# Patient Record
Sex: Male | Born: 1960 | Race: White | Hispanic: No | Smoking: Current every day smoker
Health system: Southern US, Community
[De-identification: ages and names within clinical notes are randomized; demographics above are authoritative.]

## PROBLEM LIST (undated history)

## (undated) HISTORY — PX: TONSILLECTOMY: SUR1361

## (undated) HISTORY — PX: REPLACEMENT TOTAL KNEE: SUR1224

---

## 2004-11-29 ENCOUNTER — Emergency Department: Payer: Self-pay | Admitting: Emergency Medicine

## 2008-03-15 ENCOUNTER — Ambulatory Visit: Payer: Self-pay | Admitting: Family Medicine

## 2008-09-25 ENCOUNTER — Ambulatory Visit: Payer: Self-pay | Admitting: Family Medicine

## 2008-10-10 ENCOUNTER — Ambulatory Visit: Payer: Self-pay | Admitting: Internal Medicine

## 2008-12-30 IMAGING — CT CT HEAD WITHOUT CONTRAST
1 series · 16 of 30 positions shown, 20 images · non-contrast
Comparison: none

REASON FOR EXAM: Headache status post head trauma
COMMENTS:

[Series 2: soft tissue · axial · 0.45mm/px · z∈[-186,-30]mm · 16 of 35 slices shown, 20 images]
[im 2/35  brain]
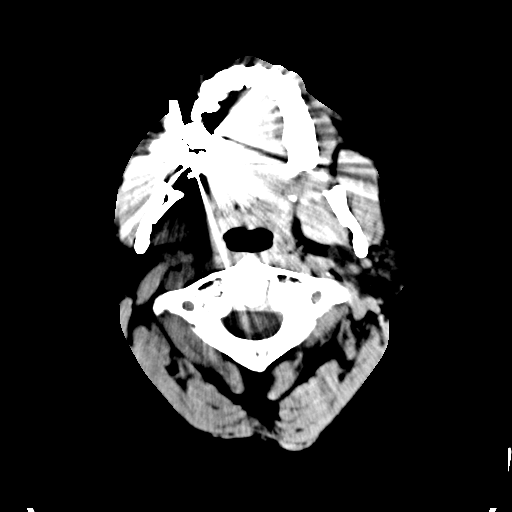
[im 2/35  bone]
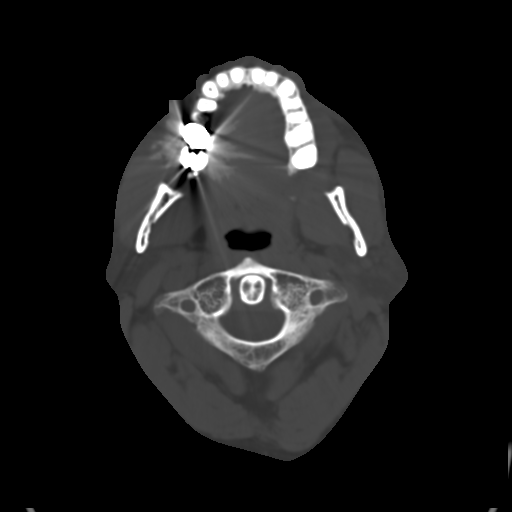
[im 4/35  brain]
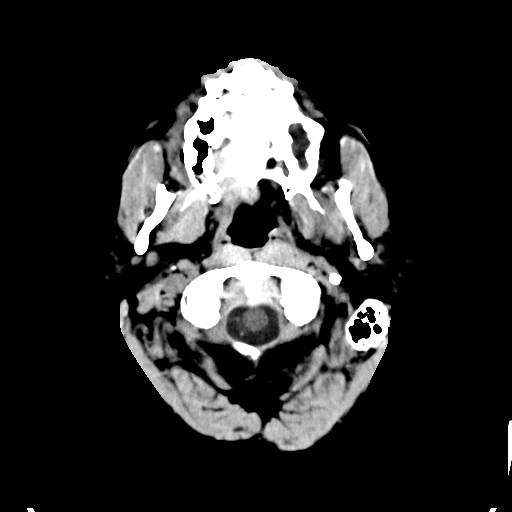
[im 6/35  brain]
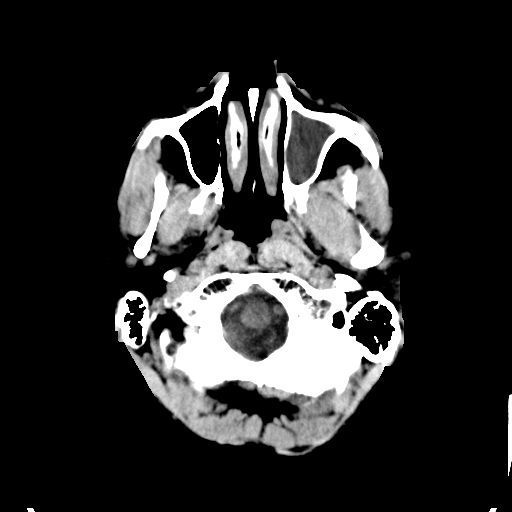
[im 9/35  brain]
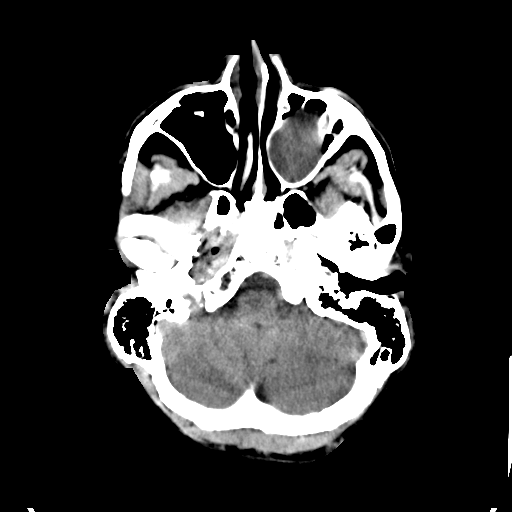
[im 10/35  brain]
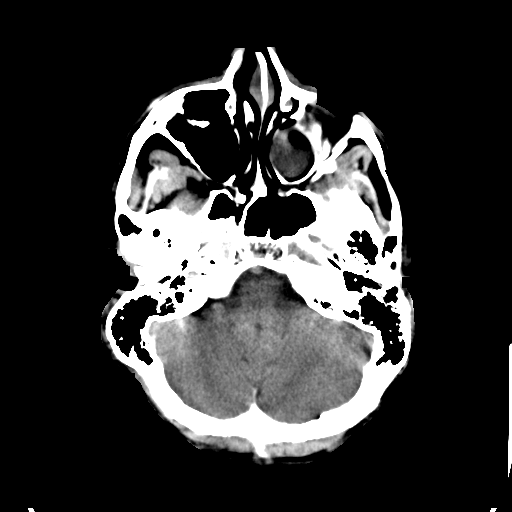
[im 10/35  bone]
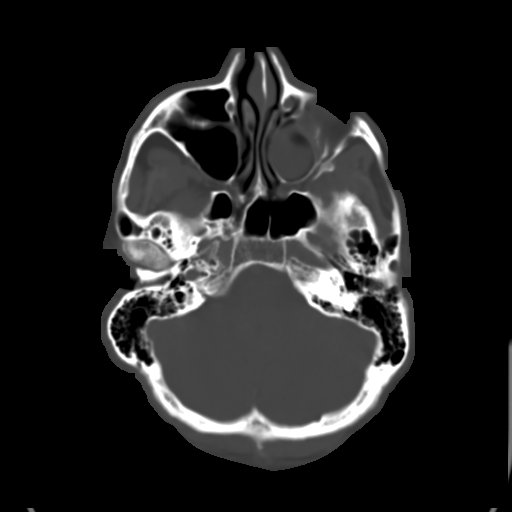
[im 12/35  brain]
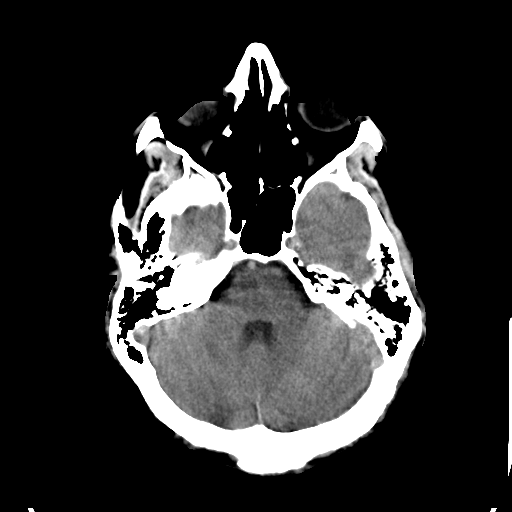
[im 15/35  brain]
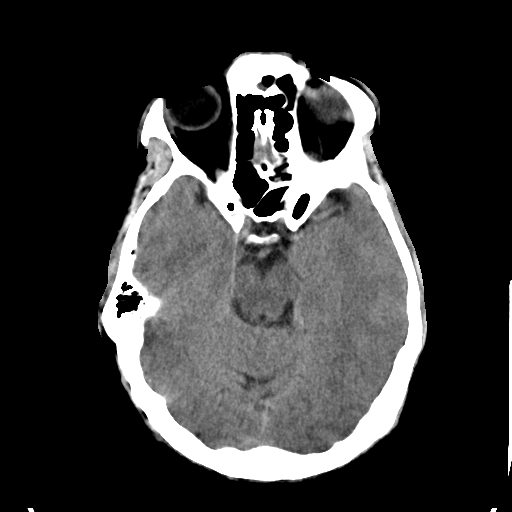
[im 17/35  brain]
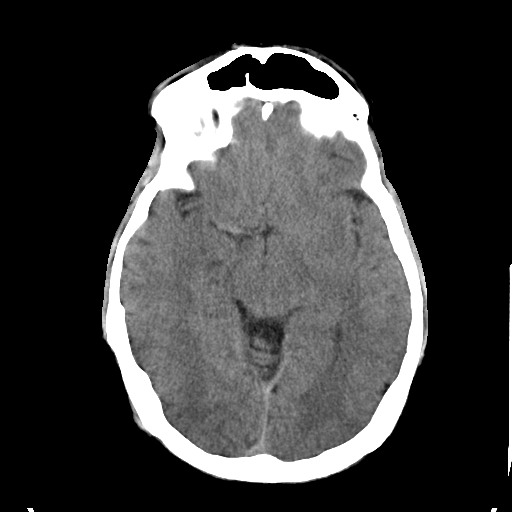
[im 18/35  brain]
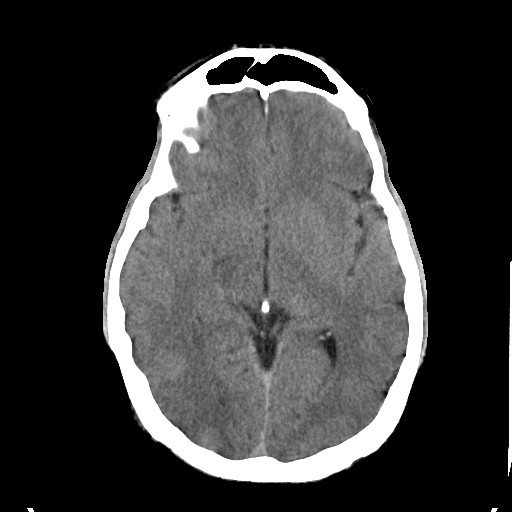
[im 18/35  bone]
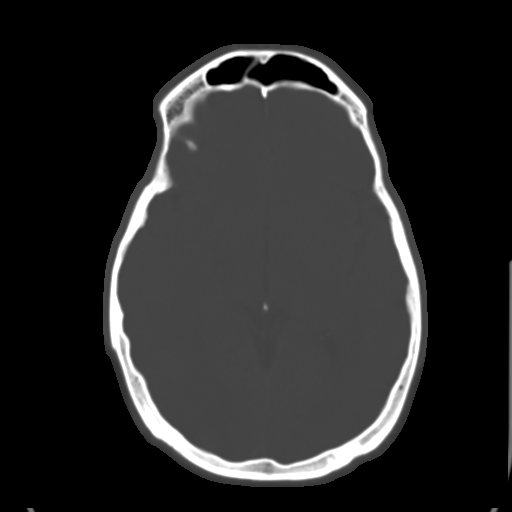
[im 20/35  brain]
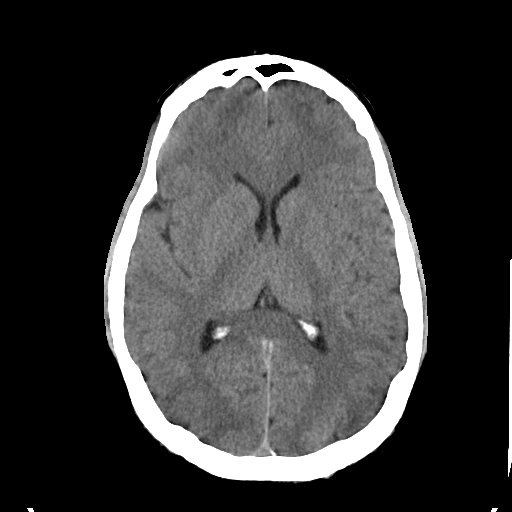
[im 23/35  brain]
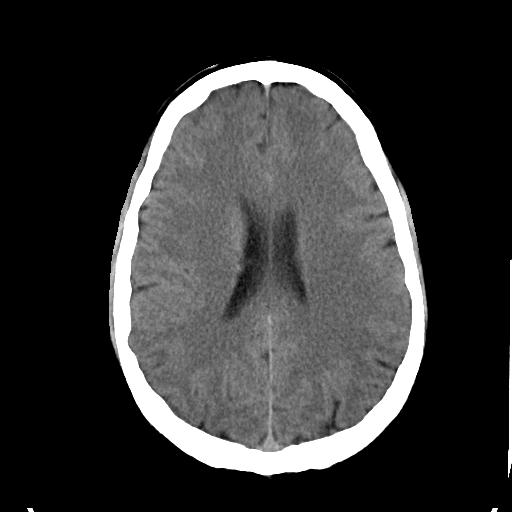
[im 25/35  brain]
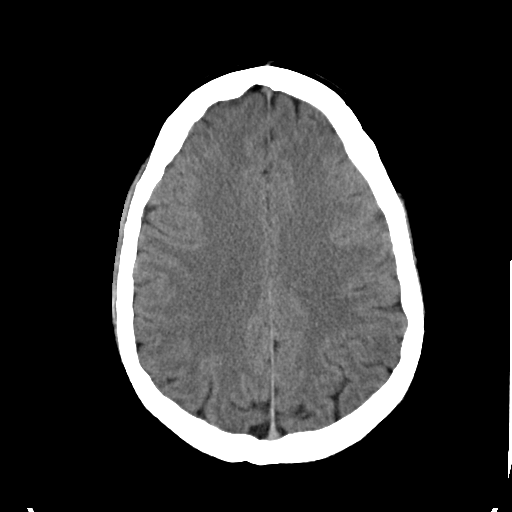
[im 26/35  brain]
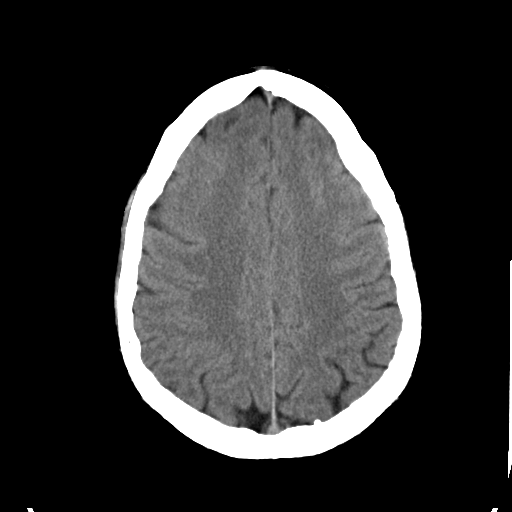
[im 26/35  bone]
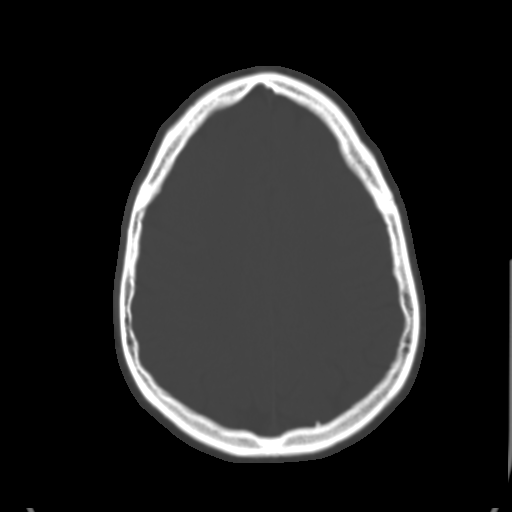
[im 29/35  brain]
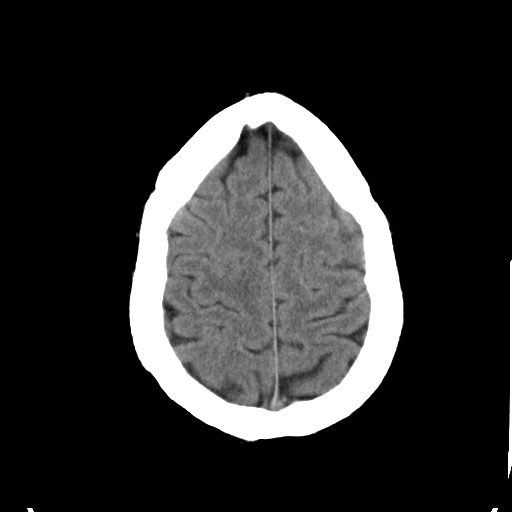
[im 31/35  brain]
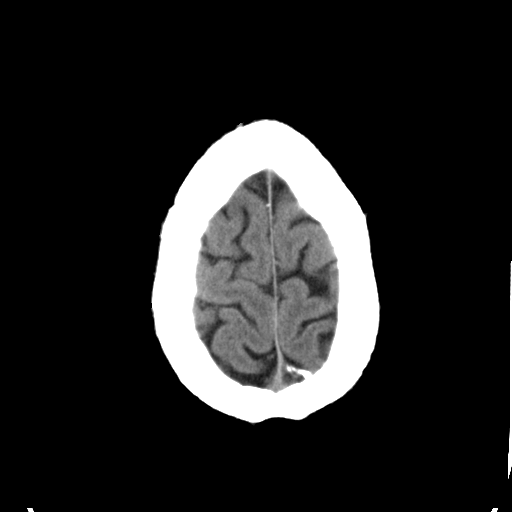
[im 33/35  brain]
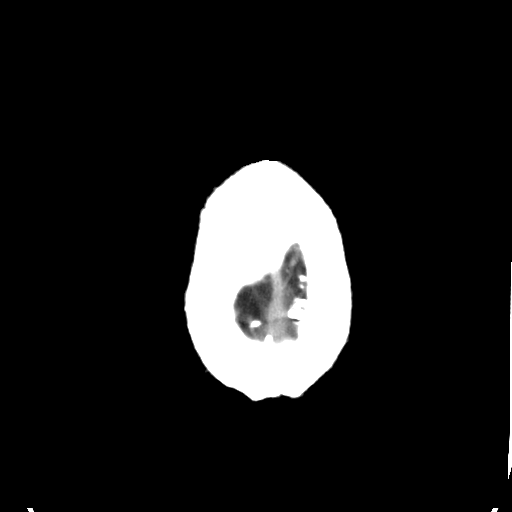

[16 of 30 positions shown; findings below may reference images not displayed]

PROCEDURE:     [facility]LE[facility] - HSSAYN ZARBAN WITHOUT CONTRAST  - March 15, 2008  [DATE]

RESULT:     There is no evidence of intra-axial or extra-axial fluid
collections or evidence of acute hemorrhage. No secondary signs are
appreciated to suggest mass effect or subacute or chronic infarction. The
visualized bony skeleton demonstrates no evidence of fracture or
dislocation. There is complete opacification of the LEFT maxillary sinus.
IMPRESSION: 1.     No evidence of acute intracranial abnormalities.
2.     Findings possibly representing sinus disease involving the LEFT
maxillary sinus.
3.     Dr. Moven Zackey of [HOSPITAL] [HOSPITAL] was informed of these findings
at the time of the initial interpretation.

## 2011-03-24 ENCOUNTER — Ambulatory Visit: Payer: Self-pay | Admitting: Internal Medicine

## 2011-09-19 ENCOUNTER — Ambulatory Visit (INDEPENDENT_AMBULATORY_CARE_PROVIDER_SITE_OTHER): Payer: Medicare FFS

## 2011-09-19 DIAGNOSIS — R05 Cough: Secondary | ICD-10-CM

## 2011-09-19 DIAGNOSIS — J029 Acute pharyngitis, unspecified: Secondary | ICD-10-CM

## 2011-09-19 DIAGNOSIS — R059 Cough, unspecified: Secondary | ICD-10-CM

## 2011-09-19 DIAGNOSIS — J209 Acute bronchitis, unspecified: Secondary | ICD-10-CM

## 2012-07-04 ENCOUNTER — Ambulatory Visit (INDEPENDENT_AMBULATORY_CARE_PROVIDER_SITE_OTHER): Payer: Medicare FFS | Admitting: Family Medicine

## 2012-07-04 VITALS — BP 133/80 | HR 80 | Temp 97.9°F | Resp 16 | Ht 72.0 in | Wt 223.0 lb

## 2012-07-04 DIAGNOSIS — H119 Unspecified disorder of conjunctiva: Secondary | ICD-10-CM

## 2012-07-04 MED ORDER — TOBRAMYCIN 0.3 % OP SOLN: 1.0000 [drp] | Freq: Four times a day (QID) | OPHTHALMIC | Status: DC

## 2012-07-04 NOTE — Patient Instructions (Addendum)
Conjunctivitis Conjunctivitis is commonly called "pink eye." Conjunctivitis can be caused by bacterial or viral infection, allergies, or injuries. There is usually redness of the lining of the eye, itching, discomfort, and sometimes discharge. There may be deposits of matter along the eyelids. A viral infection usually causes a watery discharge, while a bacterial infection causes a yellowish, thick discharge. Pink eye is very contagious and spreads by direct contact. You may be given antibiotic eyedrops as part of your treatment. Before using your eye medicine, remove all drainage from the eye by washing gently with warm water and cotton balls. Continue to use the medication until you have awakened 2 mornings in a row without discharge from the eye. Do not rub your eye. This increases the irritation and helps spread infection. Use separate towels from other household members. Wash your hands with soap and water before and after touching your eyes. Use cold compresses to reduce pain and sunglasses to relieve irritation from light. Do not wear contact lenses or wear eye makeup until the infection is gone. SEEK MEDICAL CARE IF:   Your symptoms are not better after 3 days of treatment.  You have increased pain or trouble seeing.  The outer eyelids become very red or swollen. Document Released: 10/09/2004 Document Revised: 11/24/2011 Document Reviewed: 09/01/2005 Exit[employer] Patient Information 2013 [employer].  

## 2012-07-04 NOTE — Progress Notes (Signed)
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 He's had some discharge from the eye as well. Complains of slightly blurry vision but no loss of vision, no eye pain, no fever. He has had a sinus infection recently with nasal congestion.  Objective: Inspection of the eye reveals normal extraocular motion, diffuse injection of scleral vessels, nipples equal reactive to light, normal fundus.  Assessment: Conjunctivitis, acute   1. Conjunctival disorder  tobramycin (TOBREX) 0.3 % ophthalmic solution

## 2014-03-24 ENCOUNTER — Ambulatory Visit (INDEPENDENT_AMBULATORY_CARE_PROVIDER_SITE_OTHER): Payer: Medicare FFS | Admitting: Emergency Medicine

## 2014-03-24 VITALS — BP 114/70 | HR 68 | Temp 97.6°F | Resp 16 | Ht 73.0 in | Wt 226.0 lb

## 2014-03-24 DIAGNOSIS — H00019 Hordeolum externum unspecified eye, unspecified eyelid: Secondary | ICD-10-CM

## 2014-03-24 DIAGNOSIS — H119 Unspecified disorder of conjunctiva: Secondary | ICD-10-CM

## 2014-03-24 DIAGNOSIS — H00013 Hordeolum externum right eye, unspecified eyelid: Secondary | ICD-10-CM

## 2014-03-24 MED ORDER — TOBRAMYCIN 0.3 % OP SOLN: 1.0000 [drp] | OPHTHALMIC | Status: DC

## 2014-03-24 NOTE — Progress Notes (Signed)
[facility] [address], [place] [id number] [phone]  Date:  03/24/2014   Name:  Cody MorDavid W Higgins   DOB:  09-08-1961   MRN:  [id number]  PCP:  No primary provider on file.    Chief Complaint: Belepharitis   History of Present Illness:  Cody MorDavid W Higgins is a 53 y.o. very pleasant male patient who presents with the following:  Since Tuesday has swollen right upper lid associated with redness and a scratchy feeling.  He had a "pimple" on the lid margin that is now gone.  He has no visual symptoms and no history of trauma.  Little change with warm compresses. Denies other complaint or health concern today.   There are no active problems to display for this patient.   History reviewed. No pertinent past medical history.  History reviewed. No pertinent past surgical history.  History  Substance Use Topics  . Smoking status: Current Every Day Smoker -- 30 years  . Smokeless tobacco: Not on file  . Alcohol Use: Not on file    History reviewed. No pertinent family history.  No Known Allergies  Medication list has been reviewed and updated.  No current outpatient prescriptions on file prior to visit.   No current facility-administered medications on file prior to visit.    Review of Systems:  As per HPI, otherwise negative.  ]  Physical Examination: Filed Vitals:   03/24/14 0911  BP: 114/70  Pulse: 68  Temp: 97.6 F (36.4 C)  Resp: 16   Filed Vitals:   03/24/14 0911  Height: 6\' 1"  (1.854 m)  Weight: 226 lb (102.513 kg)   Body mass index is 29.82 kg/(m^2). Ideal Body Weight: Weight in (lb) to have BMI = 25: 189.1   GEN: WDWN, NAD, Non-toxic, Alert & Oriented x 3 HEENT: Atraumatic, Normocephalic.  Right upper lid swollen and red.  No FB or injection.   Ears and Nose: No external deformity. EXTR: No clubbing/cyanosis/edema NEURO: Normal gait.  PSYCH: Normally interactive. Conversant. Not depressed or anxious appearing.  Calm demeanor.     Assessment and Plan: Horde[place]um  Signed,  Phillips OdorJeffery Deylan Canterbury, MD

## 2014-03-24 NOTE — Patient Instructions (Signed)

## 2014-06-12 ENCOUNTER — Ambulatory Visit: Payer: Self-pay | Admitting: Unknown Physician Specialty

## 2014-06-15 LAB — PATHOLOGY REPORT

## 2020-11-02 ENCOUNTER — Ambulatory Visit
Admission: EM | Admit: 2020-11-02 | Discharge: 2020-11-02 | Disposition: A | Discharge status: Home / Self Care | Payer: 59 | Attending: Internal Medicine | Admitting: Internal Medicine

## 2020-11-02 ENCOUNTER — Other Ambulatory Visit: Payer: Self-pay

## 2020-11-02 ENCOUNTER — Encounter: Payer: Self-pay | Admitting: Emergency Medicine

## 2020-11-02 DIAGNOSIS — B353 Tinea pedis: Secondary | ICD-10-CM

## 2020-11-02 MED ORDER — MICONAZOLE NITRATE 2 % EX POWD: CUTANEOUS | 0 refills | Status: DC | PRN

## 2020-11-02 MED ORDER — CEPHALEXIN 500 MG PO CAPS: 500.0000 mg | ORAL_CAPSULE | Freq: Three times a day (TID) | ORAL | 0 refills | Status: DC

## 2020-11-02 MED ORDER — CEPHALEXIN 500 MG PO CAPS: 500.0000 mg | ORAL_CAPSULE | Freq: Three times a day (TID) | ORAL | 0 refills | Status: AC

## 2020-11-02 NOTE — ED Triage Notes (Signed)
Patient states that he has infected athletes foot in his right foot that started 2 weeks ago.  Patient reports redness, swelling and pain .

## 2020-11-02 NOTE — Discharge Instructions (Addendum)
Avoid soaking your feet in water Please wear open toe footwear Use medications as directed If symptoms worsen please return to urgent care to be reevaluated.

## 2020-11-02 NOTE — ED Provider Notes (Signed)
[facility]    CSN: [id number] Arrival date & time: 11/02/20  1031      History   Chief Complaint Chief Complaint  Patient presents with  . Foot Pain    right    HPI Cody Higgins is a 60 y.o. male comes to the urgent care with 2-week history of tinea pedis involving the right fourth and fifth toes.  He has been applying antifungal spray with no improvement in his symptoms.  He recently noted increased swelling, redness and pain.  Patient is not diabetic.  He comes to the urgent care to be evaluated.  No fever or chills.  No trauma to the foot.  HPI  History reviewed. No pertinent past medical history.  There are no problems to display for this patient.   History reviewed. No pertinent surgical history.     Home Medications    Prior to Admission medications   Medication Sig Start Date End Date Taking? Authorizing Provider  miconazole (MICOTIN) 2 % powder Apply topically as needed for itching. 11/02/20  Yes Kennen Stammer, Britta Mccreedy, MD  cephALEXin (KEFLEX) 500 MG capsule Take 1 capsule (500 mg total) by mouth 3 (three) times daily for 5 days. 11/02/20 11/07/20  Merrilee Jansky, MD    Family History History reviewed. No pertinent family history.  Social History Social History   Tobacco Use  . Smoking status: Current Every Day Smoker    Years: 30.00    Types: Cigarettes  . Smokeless tobacco: Never Used  Vaping Use  . Vaping Use: Never used  Substance Use Topics  . Alcohol use: Not Currently  . Drug use: Never     Allergies   Patient has no known allergies.   Review of Systems Review of Systems  Musculoskeletal: Positive for arthralgias and joint swelling. Negative for myalgias.  Skin: Positive for color change. Negative for pallor and wound.  Neurological: Negative.      Physical Exam Triage Vital Signs ED Triage Vitals  Enc Vitals Group     BP 11/02/20 1100 120/84     Pulse Rate 11/02/20 1100 60     Resp 11/02/20 1100 16     Temp  11/02/20 1100 98.8 F (37.1 C)     Temp Source 11/02/20 1100 Oral     SpO2 11/02/20 1100 97 %     Weight 11/02/20 1058 230 lb (104.3 kg)     Height 11/02/20 1058 6\' 1"  (1.854 m)     Head Circumference --      Peak Flow --      Pain Score 11/02/20 1058 1     Pain Loc --      Pain Edu? --      Excl. in GC? --    No data found.  Updated Vital Signs BP 120/84 (BP Location: Right Arm)   Pulse 60   Temp 98.8 F (37.1 C) (Oral)   Resp 16   Ht 6\' 1"  (1.854 m)   Wt 104.3 kg   SpO2 97%   BMI 30.34 kg/m   Visual Acuity Right Eye Distance:   Left Eye Distance:   Bilateral Distance:    Right Eye Near:   Left Eye Near:    Bilateral Near:     Physical Exam Vitals and nursing note reviewed.  Musculoskeletal:        General: Swelling and tenderness present. No deformity. Normal range of motion.  Skin:    General: Skin is warm.  Coloration: Skin is not pale.     Findings: Erythema present. No bruising or lesion.      UC Treatments / Results  Labs (all labs ordered are listed, but only abnormal results are displayed) Labs Reviewed - No data to display  EKG   Radiology No results found.  Procedures Procedures (including critical care time)  Medications Ordered in UC Medications - No data to display  Initial Impression / Assessment and Plan / UC Course  I have reviewed the triage vital signs and the nursing notes.  Pertinent labs & imaging results that were available during my care of the patient were reviewed by me and considered in my medical decision making (see chart for details).     1.  Tinea pedis of the right foot with cellulitic changes: Miconazole powder Avoid soaking feet in order Keflex 500 mg 3 times daily for 5 days Open to foot wear advised Return to urgent care if symptoms worsen. Final Clinical Impressions(s) / UC Diagnoses   Final diagnoses:  Tinea pedis of right foot     Discharge Instructions     Avoid soaking your feet in  water Please wear open toe footwear Use medications as directed If symptoms worsen please return to urgent care to be reevaluated.   ED Prescriptions    Medication Sig Dispense Auth. Provider   miconazole (MICOTIN) 2 % powder Apply topically as needed for itching. 70 g Kynslei Art, Britta Mccreedy, MD   cephALEXin (KEFLEX) 500 MG capsule  (Status: Discontinued) Take 1 capsule (500 mg total) by mouth 3 (three) times daily for 5 days. 15 capsule Wayne Wicklund, Britta Mccreedy, MD   cephALEXin (KEFLEX) 500 MG capsule Take 1 capsule (500 mg total) by mouth 3 (three) times daily for 5 days. 15 capsule Soniyah Mcglory, Britta Mccreedy, MD     PDMP not reviewed this encounter.   Merrilee Jansky, MD 11/02/20 [phone]

## 2021-01-09 ENCOUNTER — Other Ambulatory Visit: Payer: Self-pay

## 2021-01-09 ENCOUNTER — Encounter: Payer: Self-pay | Admitting: Emergency Medicine

## 2021-01-09 ENCOUNTER — Emergency Department: Payer: 59

## 2021-01-09 ENCOUNTER — Emergency Department
Admission: EM | Admit: 2021-01-09 | Discharge: 2021-01-09 | Disposition: A | Discharge status: Home / Self Care | Payer: 59 | Attending: Emergency Medicine | Admitting: Emergency Medicine

## 2021-01-09 DIAGNOSIS — F17210 Nicotine dependence, cigarettes, uncomplicated: Secondary | ICD-10-CM | POA: Insufficient documentation

## 2021-01-09 DIAGNOSIS — J441 Chronic obstructive pulmonary disease with (acute) exacerbation: Secondary | ICD-10-CM | POA: Insufficient documentation

## 2021-01-09 DIAGNOSIS — R0789 Other chest pain: Secondary | ICD-10-CM

## 2021-01-09 DIAGNOSIS — R079 Chest pain, unspecified: Secondary | ICD-10-CM | POA: Diagnosis present

## 2021-01-09 LAB — CBC
HCT: 42.8 % (ref 39.0–52.0)
Hemoglobin: 14.5 g/dL (ref 13.0–17.0)
MCH: 29.4 pg (ref 26.0–34.0)
MCHC: 33.9 g/dL (ref 30.0–36.0)
MCV: 86.6 fL (ref 80.0–100.0)
Platelets: 252 10*3/uL (ref 150–400)
RBC: 4.94 MIL/uL (ref 4.22–5.81)
RDW: 14.4 % (ref 11.5–15.5)
WBC: 9.3 10*3/uL (ref 4.0–10.5)
nRBC: 0 % (ref 0.0–0.2)

## 2021-01-09 LAB — BASIC METABOLIC PANEL
Anion gap: 10 (ref 5–15)
BUN: 16 mg/dL (ref 6–20)
CO2: 23 mmol/L (ref 22–32)
Calcium: 9 mg/dL (ref 8.9–10.3)
Chloride: 105 mmol/L (ref 98–111)
Creatinine, Ser: 1.25 mg/dL — ABNORMAL HIGH (ref 0.61–1.24)
GFR, Estimated: >60 mL/min (ref >60)
Glucose, Bld: 104 mg/dL — ABNORMAL HIGH (ref 70–99)
Potassium: 3.9 mmol/L (ref 3.5–5.1)
Sodium: 138 mmol/L (ref 135–145)

## 2021-01-09 LAB — TROPONIN I (HIGH SENSITIVITY)
Troponin I (High Sensitivity): 4 ng/L (ref <18)
Troponin I (High Sensitivity): 4 ng/L (ref <18)

## 2021-01-09 MED ORDER — PREDNISONE 50 MG PO TABS: 50.0000 mg | ORAL_TABLET | Freq: Every day | ORAL | 0 refills | Status: DC

## 2021-01-09 MED ORDER — METHYLPREDNISOLONE SODIUM SUCC 125 MG IJ SOLR
125.0000 mg | Freq: Once | INTRAMUSCULAR | Status: AC
  Administered 2021-01-09: 125 mg via INTRAVENOUS
  Filled 2021-01-09: qty 2

## 2021-01-09 MED ORDER — ALBUTEROL SULFATE HFA 108 (90 BASE) MCG/ACT IN AERS: 2.0000 | INHALATION_SPRAY | Freq: Four times a day (QID) | RESPIRATORY_TRACT | 2 refills | Status: DC | PRN

## 2021-01-09 MED ORDER — IPRATROPIUM-ALBUTEROL 0.5-2.5 (3) MG/3ML IN SOLN
3.0000 mL | Freq: Once | RESPIRATORY_TRACT | Status: AC
  Administered 2021-01-09: 3 mL via RESPIRATORY_TRACT
  Filled 2021-01-09: qty 3

## 2021-01-09 MED ORDER — PREDNISONE 50 MG PO TABS: 50.0000 mg | ORAL_TABLET | Freq: Every day | ORAL | 0 refills | Status: AC

## 2021-01-09 NOTE — Discharge Instructions (Signed)
As we discussed, I suspect your symptoms are more related to your lungs and your heart.  If you continue to smoke, these will make it worse and more frequent.  You are being discharged with a prescription for prednisone steroids to take once daily for the next 4 days starting tomorrow.  We gave a dose of steroids today and IV while you are here. Albuterol inhaler/puffer to use at home as needed.  You do not have to use this every day.  Use as needed for any shortness of breath.  Use 1-2 puffs per dose, do not use more frequently than every 4-6 hours.  Attached is a phone number for [facility] to call and establish with a primary care physician.  Return to the ED with any further worsening symptoms, fevers with your symptoms, passing out or vomiting with your symptoms.

## 2021-01-09 NOTE — ED Notes (Signed)
Report received from Kassie RN

## 2021-01-09 NOTE — ED Triage Notes (Signed)
Pt to ED via POV w/ c/o mid/L upper chest pain that radiates to L arm "all the way down to fingers". Pt states pain x several weeks.

## 2021-01-09 NOTE — ED Provider Notes (Signed)
[facility] Emergency Department Provider Note ____________________________________________   Event Date/Time   First MD Initiated Contact with Patient 01/09/21 1806     (approximate)  I have reviewed the triage vital signs and the nursing notes.  HISTORY  Chief Complaint Chest Pain   HPI Cody Higgins is a 60 y.o. malewho presents to the ED for evaluation of chest pain.   Chart review indicates neuro medical history. Patient reports lifelong smoking history for which he takes no inhalers medications.  2 episodes of chest pain over the past couple weeks.  Shortness of breath and increased cough without sputum production alongside this, but more consistently.  Denies fevers, emesis or syncopal episodes.  Denies chest pain now and reports feeling okay.  No past medical history on file.  There are no problems to display for this patient.   Past Surgical History:  Procedure Laterality Date  . REPLACEMENT TOTAL KNEE    . TONSILLECTOMY      Prior to Admission medications   Medication Sig Start Date End Date Taking? Authorizing Provider  albuterol (VENTOLIN HFA) 108 (90 Base) MCG/ACT inhaler Inhale 2 puffs into the lungs every 6 (six) hours as needed for wheezing or shortness of breath. 01/09/21   Delton Prairie, MD  miconazole (MICOTIN) 2 % powder Apply topically as needed for itching. 11/02/20   Lamptey, Britta Mccreedy, MD  predniSONE (DELTASONE) 50 MG tablet Take 1 tablet (50 mg total) by mouth daily with breakfast for 4 days. 01/09/21 01/13/21  Delton Prairie, MD    Allergies Patient has no known allergies.  No family history on file.  Social History Social History   Tobacco Use  . Smoking status: Current Every Day Smoker    Years: 30.00    Types: Cigarettes  . Smokeless tobacco: Never Used  Vaping Use  . Vaping Use: Never used  Substance Use Topics  . Alcohol use: Not Currently  . Drug use: Never    Review of Systems  Constitutional: No  fever/chills Eyes: No visual changes. ENT: No sore throat. Cardiovascular: Positive for chest pain Respiratory: Positive for cough and shortness of breath Gastrointestinal: No abdominal pain.  No nausea, no vomiting.  No diarrhea.  No constipation. Genitourinary: Negative for dysuria. Musculoskeletal: Negative for back pain. Skin: Negative for rash. Neurological: Negative for headaches, focal weakness or numbness.  ____________________________________________   PHYSICAL EXAM:  VITAL SIGNS: Vitals:   01/09/21 2030 01/09/21 2100  BP: 113/72 (!) 115/92  Pulse: (!) 51 (!) 57  Resp: (!) 24 (!) 23  Temp:    SpO2: 100% 93%     Constitutional: Alert and oriented. Well appearing and in no acute distress. Eyes: Conjunctivae are normal. PERRL. EOMI. Head: Atraumatic. Nose: No congestion/rhinnorhea. Mouth/Throat: Mucous membranes are moist.  Oropharynx non-erythematous. Neck: No stridor. No cervical spine tenderness to palpation. Cardiovascular: Normal rate, regular rhythm. Grossly normal heart sounds.  Good peripheral circulation. Respiratory: Normal respiratory effort.  No retractions.  Prolonged expiratory phase and diffuse scattered expiratory wheezes.  No focal features.. Gastrointestinal: Soft , nondistended, nontender to palpation. No CVA tenderness. Musculoskeletal: No lower extremity tenderness nor edema.  No joint effusions. No signs of acute trauma. Neurologic:  Normal speech and language. No gross focal neurologic deficits are appreciated. No gait instability noted. Skin:  Skin is warm, dry and intact. No rash noted. Psychiatric: Mood and affect are normal. Speech and behavior are normal.  ____________________________________________   LABS (all labs ordered are listed, but only abnormal results  are displayed)  Labs Reviewed  BASIC METABOLIC PANEL - Abnormal; Notable for the following components:      Result Value   Glucose, Bld 104 (*)    Creatinine, Ser 1.25 (*)     All other components within normal limits  CBC  TROPONIN I (HIGH SENSITIVITY)  TROPONIN I (HIGH SENSITIVITY)   ____________________________________________  12 Lead EKG  Sinus rhythm, rate of 55 bpm.  Normal axis and normal intervals.  No evidence of acute ischemia.  Sinus bradycardia. ____________________________________________  RADIOLOGY  ED MD interpretation: 2 view CXR reviewed by me without evidence of acute cardiopulmonary pathology.  Official radiology report(s): DG Chest 2 View  Result Date: 01/09/2021 CLINICAL DATA:  Chest pain EXAM: CHEST - 2 VIEW COMPARISON:  September 19, 2011 FINDINGS: The heart size and mediastinal contours are within normal limits. Linear band of atelectasis or scarring in the peripheral left mid lung. No focal consolidation. No pleural effusion. No pneumothorax. No acute osseous abnormality visualized. IMPRESSION: 1. No acute cardiopulmonary disease Electronically Signed   By: Maudry Mayhew MD   On: 01/09/2021 18:49    ____________________________________________   PROCEDURES and INTERVENTIONS  Procedure(s) performed (including Critical Care):  .1-3 Lead EKG Interpretation Performed by: Delton Prairie, MD Authorized by: Delton Prairie, MD     Interpretation: normal     ECG rate:  58   ECG rate assessment: normal     Rhythm: sinus rhythm     Ectopy: none     Conduction: normal      Medications  methylPREDNISolone sodium succinate (SOLU-MEDROL) 125 mg/2 mL injection 125 mg (125 mg Intravenous Given 01/09/21 2023)  ipratropium-albuterol (DUONEB) 0.5-2.5 (3) MG/3ML nebulizer solution 3 mL (3 mLs Nebulization Given 01/09/21 2024)    ____________________________________________   MDM / ED COURSE   60 year old lifelong smoker presents to the ED with intermittent chest pains, shortness of breath and nonproductive cough, consistent with COPD exacerbation without evidence of ACS, and amenable to outpatient management.  Normal vitals on room air.   Exam with some stigmata of COPD exacerbation with diffuse expiratory wheezes and prolonged expiratory phase, but no distress.  No evidence of ACS, acute ischemia, NSTEMI.  CXR without infiltrates and patient has no increased sputum production to suggest infectious etiology of his symptoms.  Resolving symptoms with steroids and albuterol.  We discussed management of his respiratory status at home, recommended cessation from tobacco.  We discussed return precautions for the ED     ____________________________________________   FINAL CLINICAL IMPRESSION(S) / ED DIAGNOSES  Final diagnoses:  Other chest pain  COPD exacerbation [facility])     ED Discharge Orders         Ordered    predniSONE (DELTASONE) 50 MG tablet  Daily with breakfast,   Status:  Discontinued        01/09/21 2133    albuterol (VENTOLIN HFA) 108 (90 Base) MCG/ACT inhaler  Every 6 hours PRN,   Status:  Discontinued        01/09/21 2133    predniSONE (DELTASONE) 50 MG tablet  Daily with breakfast        01/09/21 2149    albuterol (VENTOLIN HFA) 108 (90 Base) MCG/ACT inhaler  Every 6 hours PRN        01/09/21 2149           Jeramie Scogin   Note:  This document was prepared using Dragon voice recognition software and may include unintentional dictation errors.   Delton Prairie, MD 01/10/21 [phone]

## 2021-01-09 NOTE — ED Notes (Signed)
Patient transported to X-ray 

## 2021-06-17 ENCOUNTER — Encounter: Payer: Self-pay | Admitting: Emergency Medicine

## 2021-06-17 ENCOUNTER — Other Ambulatory Visit: Payer: Self-pay

## 2021-06-17 ENCOUNTER — Ambulatory Visit
Admission: EM | Admit: 2021-06-17 | Discharge: 2021-06-17 | Disposition: A | Discharge status: Home / Self Care | Payer: 59 | Attending: Emergency Medicine | Admitting: Emergency Medicine

## 2021-06-17 DIAGNOSIS — S0502XA Injury of conjunctiva and corneal abrasion without foreign body, left eye, initial encounter: Secondary | ICD-10-CM

## 2021-06-17 DIAGNOSIS — Z23 Encounter for immunization: Secondary | ICD-10-CM

## 2021-06-17 MED ORDER — TETANUS-DIPHTH-ACELL PERTUSSIS 5-2.5-18.5 LF-MCG/0.5 IM SUSY
0.5000 mL | PREFILLED_SYRINGE | Freq: Once | INTRAMUSCULAR | Status: AC
  Administered 2021-06-17: 0.5 mL via INTRAMUSCULAR

## 2021-06-17 MED ORDER — TETRACAINE HCL 0.5 % OP SOLN
2.0000 [drp] | Freq: Once | OPHTHALMIC | Status: AC
  Administered 2021-06-17: 2 [drp] via OPHTHALMIC

## 2021-06-17 MED ORDER — TOBRAMYCIN-DEXAMETHASONE 0.3-0.1 % OP SUSP: 2.0000 [drp] | Freq: Four times a day (QID) | OPHTHALMIC | 0 refills | Status: DC

## 2021-06-17 MED ORDER — FLUORESCEIN SODIUM 1 MG OP STRP
1.0000 | ORAL_STRIP | Freq: Once | OPHTHALMIC | Status: AC
  Administered 2021-06-17: 1 via OPHTHALMIC

## 2021-06-17 NOTE — ED Triage Notes (Signed)
Pt c/o foreign body sensation in his left eye. He states he was using a metal grinder and thinks something got underneath his glasses. Eye is red, painful and sensitive to light.

## 2021-06-17 NOTE — ED Provider Notes (Signed)
[facility]    CSN: [id number] Arrival date & time: 06/17/21  1738      History   Chief Complaint Chief Complaint  Patient presents with   Eye Problem    left    HPI Cody Higgins is a 60 y.o. male.   HPI  60 year old male here for evaluation of left eye complaint.  Patient ports that he was grinding metal yesterday and he thinks that a piece of metal got underneath his safety glasses and in his left eye.  He states that he flushed his eye yesterday but did not help his symptoms.  He states he did not sleep much last night because his eye was burning him along.  Today he is complaining of some blurry vision, light sensitivity, and a continued foreign body sensation in his left eye.  Patient is unsure when his last tetanus shot was.  History reviewed. No pertinent past medical history.  There are no problems to display for this patient.   Past Surgical History:  Procedure Laterality Date   REPLACEMENT TOTAL KNEE     TONSILLECTOMY         Home Medications    Prior to Admission medications   Medication Sig Start Date End Date Taking? Authorizing Provider  tobramycin-dexamethasone [facility]) ophthalmic solution Place 2 drops into the left eye every 6 (six) hours. 06/17/21  Yes Becky Augusta, NP    Family History History reviewed. No pertinent family history.  Social History Social History   Tobacco Use   Smoking status: Every Day    Years: 30.00    Types: Cigarettes   Smokeless tobacco: Never  Vaping Use   Vaping Use: Never used  Substance Use Topics   Alcohol use: Not Currently   Drug use: Never     Allergies   Patient has no known allergies.   Review of Systems Review of Systems  Constitutional:  Negative for activity change, appetite change and fever.  Eyes:  Positive for photophobia, pain, redness and visual disturbance. Negative for discharge and itching.  Hematological: Negative.   Psychiatric/Behavioral: Negative.       Physical Exam Triage Vital Signs ED Triage Vitals  Enc Vitals Group     BP 06/17/21 1812 125/86     Pulse Rate 06/17/21 1812 70     Resp 06/17/21 1812 18     Temp 06/17/21 1812 98.2 F (36.8 C)     Temp Source 06/17/21 1812 Oral     SpO2 06/17/21 1812 96 %     Weight 06/17/21 1810 240 lb 0.9 oz (108.9 kg)     Height 06/17/21 1810 6\' 1"  (1.854 m)     Head Circumference --      Peak Flow --      Pain Score 06/17/21 1809 6     Pain Loc --      Pain Edu? --      Excl. in GC? --    No data found.  Updated Vital Signs BP 125/86 (BP Location: Left Arm)   Pulse 70   Temp 98.2 F (36.8 C) (Oral)   Resp 18   Ht 6\' 1"  (1.854 m)   Wt 240 lb 0.9 oz (108.9 kg)   SpO2 96%   BMI 31.67 kg/m   Visual Acuity Right Eye Distance: 20/30 uncorrected Left Eye Distance: 20/50 uncorrected Bilateral Distance: 20/25 uncorrected  Right Eye Near:   Left Eye Near:    Bilateral Near:     Physical Exam  Vitals and nursing note reviewed.  Constitutional:      General: He is not in acute distress.    Appearance: Normal appearance. He is normal weight. He is not ill-appearing.  HENT:     Head: Normocephalic and atraumatic.  Eyes:     General: No scleral icterus.       Left eye: No discharge.     Extraocular Movements: Extraocular movements intact.     Conjunctiva/sclera: Conjunctivae normal.     Pupils: Pupils are equal, round, and reactive to light.  Skin:    General: Skin is warm and dry.     Capillary Refill: Capillary refill takes less than 2 seconds.     Findings: No erythema or rash.  Neurological:     General: No focal deficit present.     Mental Status: He is alert and oriented to person, place, and time.  Psychiatric:        Mood and Affect: Mood normal.        Behavior: Behavior normal.        Thought Content: Thought content normal.        Judgment: Judgment normal.     UC Treatments / Results  Labs (all labs ordered are listed, but only abnormal results are  displayed) Labs Reviewed - No data to display  EKG   Radiology No results found.  Procedures Procedures (including critical care time)  Medications Ordered in UC Medications  tetracaine (PONTOCAINE) 0.5 % ophthalmic solution 2 drop (has no administration in time range)  fluorescein ophthalmic strip 1 strip (has no administration in time range)  Tdap (BOOSTRIX) injection 0.5 mL (0.5 mLs Intramuscular Given 06/17/21 1832)    Initial Impression / Assessment and Plan / UC Course  I have reviewed the triage vital signs and the nursing notes.  Pertinent labs & imaging results that were available during my care of the patient were reviewed by me and considered in my medical decision making (see chart for details).  Patient is a very pleasant, nontoxic-appearing 60 year old male here for evaluation of foreign body sensation in his left eye that started yesterday.  He states he was grinding metal and he thinks a piece of metal might of gotten underneath his safety glasses.  He states he flushed his eye yesterday and then again today.  He did not see any foreign material in the eyewash solution.  He states that all night last night his eye was burning.  Today he is complaining of photophobia, mildly blurry vision secondary to the tearing, and a continued foreign body sensation.  Physical exam reveals an erythematous and injected left eye.  Pupils equal round and reactive.  The eye was anesthetized using 2 drops of tetracaine.  The patient reports that he feels a foreign body sensation under the upper eyelid on the outer aspect.  The eyelid was everted and there is no visible foreign body located.  The eye was then stained using fluorescein dye and examined with a Woods lamp.  No corneal abrasions or foreign bodies were noted.  Will treat patient empirically with TobraDex eyedrops, 2 drops every 6 hours for 7 days.  Will also update patient's tetanus shot.  I have advised the patient that if his pain  does not improve, or worsens, or if he has increased redness, or drainage from his eye he needs to go see ophthalmology.  Patient verbalizes understanding.   Final Clinical Impressions(s) / UC Diagnoses   Final diagnoses:  Abrasion of left cornea,  initial encounter     Discharge Instructions      Instill 2 drops of TobraDex in your right eye 4 times a day for 5 days.  Abstain from wearing contacts until your symptoms have completely resolved.  When you do resume wearing contacts start with a fresh pair.  Make sure that you are storing your contracts in a clean with clean saline nightly.  Wash your contacts with an enzymatic cleaner once weekly and rinse them thoroughly with new, clean contact lens solution to remove any enzymatic residue.  If you have any worsening of your symptoms such as increased pain, increased redness, increased photosensitivity, or changes in your vision you need to follow-up with your eye doctor.      ED Prescriptions     Medication Sig Dispense Auth. Provider   tobramycin-dexamethasone [facility]) ophthalmic solution Place 2 drops into the left eye every 6 (six) hours. 5 mL Becky Augusta, NP      PDMP not reviewed this encounter.   Becky Augusta, NP 06/17/21 [phone]

## 2021-06-17 NOTE — Discharge Instructions (Signed)
Instill 2 drops of TobraDex in your right eye 4 times a day for 5 days.  Abstain from wearing contacts until your symptoms have completely resolved.  When you do resume wearing contacts start with a fresh pair.  Make sure that you are storing your contracts in a clean with clean saline nightly.  Wash your contacts with an enzymatic cleaner once weekly and rinse them thoroughly with new, clean contact lens solution to remove any enzymatic residue.  If you have any worsening of your symptoms such as increased pain, increased redness, increased photosensitivity, or changes in your vision you need to follow-up with your eye doctor.  

## 2021-09-27 ENCOUNTER — Other Ambulatory Visit: Payer: Self-pay

## 2021-09-27 ENCOUNTER — Encounter: Payer: Self-pay | Admitting: Emergency Medicine

## 2021-09-27 ENCOUNTER — Ambulatory Visit
Admission: EM | Admit: 2021-09-27 | Discharge: 2021-09-27 | Disposition: A | Discharge status: Home / Self Care | Payer: 59

## 2021-09-27 DIAGNOSIS — T1591XA Foreign body on external eye, part unspecified, right eye, initial encounter: Secondary | ICD-10-CM

## 2021-09-27 NOTE — ED Triage Notes (Signed)
Patient thinks that he might have a piece of metal in his right eye for a week.  Patient states that he has tried flushing his eye out.  Patient reports some discomfort and watery drainage.

## 2021-09-27 NOTE — ED Provider Notes (Signed)
[facility]    CSN: [id number] Arrival date & time: 09/27/21  1026      History   Chief Complaint Chief Complaint  Patient presents with   Eye Problem    HPI Cody Higgins is a 61 y.o. male.   HPI  Eye Problem: Patient reports that 1 to 2 weeks ago he was grinding a metal piece from a car when he felt something go into his right eye.  He states that he was wearing glasses but not goggles.  He has tried flushing the eye out multiple times without much improvement.  He states that when he wakes up the eye feels fine but as the day goes on he starts to have some discomfort in the eye and some watery drainage.  He states that this is the third time that he has had some kind of foreign body in his eye from the grinder and he is going to switch to goggles next time. No visual changes, fever or eyeball pain.   History reviewed. No pertinent past medical history.  There are no problems to display for this patient.   Past Surgical History:  Procedure Laterality Date   REPLACEMENT TOTAL KNEE     TONSILLECTOMY         Home Medications    Prior to Admission medications   Medication Sig Start Date End Date Taking? Authorizing Provider  tobramycin-dexamethasone [facility]) ophthalmic solution Place 2 drops into the left eye every 6 (six) hours. 06/17/21   Becky Augusta, NP    Family History History reviewed. No pertinent family history.  Social History Social History   Tobacco Use   Smoking status: Every Day    Years: 30.00    Types: Cigarettes   Smokeless tobacco: Never  Vaping Use   Vaping Use: Never used  Substance Use Topics   Alcohol use: Not Currently   Drug use: Never     Allergies   Patient has no known allergies.   Review of Systems Review of Systems  As stated above in HPI Physical Exam Triage Vital Signs ED Triage Vitals  Enc Vitals Group     BP 09/27/21 1057 123/85     Pulse Rate 09/27/21 1057 71     Resp 09/27/21 1057 14     Temp  09/27/21 1057 98.5 F (36.9 C)     Temp Source 09/27/21 1057 Oral     SpO2 09/27/21 1057 97 %     Weight 09/27/21 1055 240 lb (108.9 kg)     Height 09/27/21 1055 6\' 1"  (1.854 m)     Head Circumference --      Peak Flow --      Pain Score 09/27/21 1054 5     Pain Loc --      Pain Edu? --      Excl. in GC? --    No data found.  Updated Vital Signs BP 123/85 (BP Location: Left Arm)    Pulse 71    Temp 98.5 F (36.9 C) (Oral)    Resp 14    Ht 6\' 1"  (1.854 m)    Wt 240 lb (108.9 kg)    SpO2 97%    BMI 31.66 kg/m   Physical Exam Vitals and nursing note reviewed.  Constitutional:      General: He is not in acute distress.    Appearance: Normal appearance. He is not ill-appearing, toxic-appearing or diaphoretic.  HENT:     Head: Normocephalic and atraumatic.  Eyes:     General: Lids are normal. Vision grossly intact. Gaze aligned appropriately. No allergic shiner, visual field deficit or scleral icterus.       Right eye: No foreign body, discharge or hordeolum.        Left eye: No foreign body, discharge or hordeolum.     Extraocular Movements: Extraocular movements intact.     Conjunctiva/sclera: Conjunctivae normal.   Neurological:     Mental Status: He is alert.     UC Treatments / Results  Labs (all labs ordered are listed, but only abnormal results are displayed) Labs Reviewed - No data to display  EKG   Radiology No results found.  Procedures Procedures (including critical care time)  Medications Ordered in UC Medications - No data to display  Initial Impression / Assessment and Plan / UC Course  I have reviewed the triage vital signs and the nursing notes.  Pertinent labs & imaging results that were available during my care of the patient were reviewed by me and considered in my medical decision making (see chart for details).     New.  He appears to have a foreign body in the right lower eyelid.  Using a sterile Q-tip the area was gently probed to see if  this metal shard could be removed easily.  The shard did not move.  Patient did have relief of symptoms with the numbing drops used and no significant abnormality seen on fluorescein dye exam.  He will go to his eye doctor-[facility]-following our visit as he likely will need additional efforts and tools that I do not have in office today to help remove the shard without causing further damage. Final Clinical Impressions(s) / UC Diagnoses   Final diagnoses:  None   Discharge Instructions   None    ED Prescriptions   None    PDMP not reviewed this encounter.   Rushie Chestnut, [place] 09/27/21 1219

## 2021-09-27 NOTE — Discharge Instructions (Addendum)
Please go straight to your eye specialist for further removal efforts

## 2021-10-26 IMAGING — CR DG CHEST 2V
2 series · 2 of 2 positions shown · non-contrast
Comparison: September 19, 2011

CLINICAL DATA: Chest pain

EXAM:
CHEST - 2 VIEW

[chest pa]
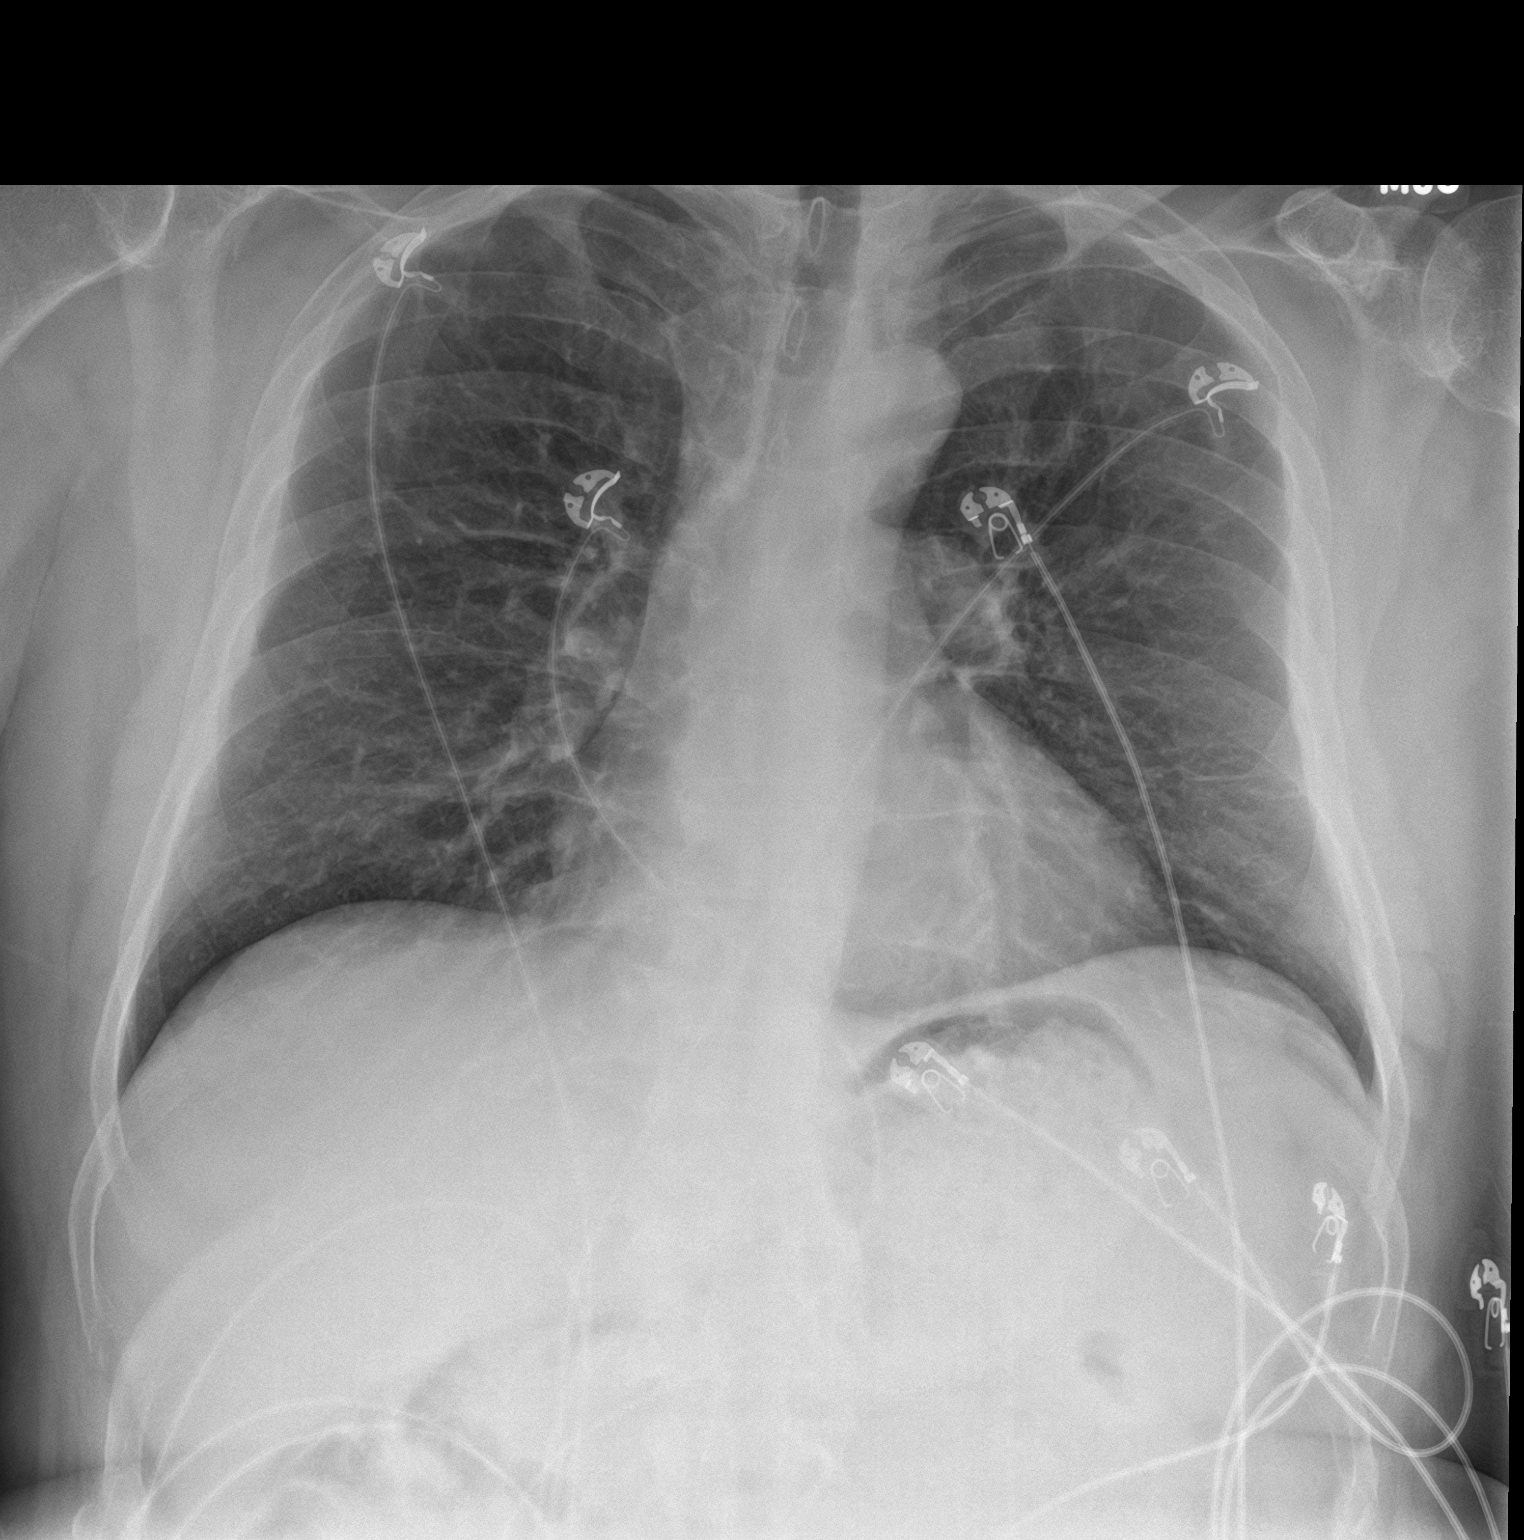

[chest lat]
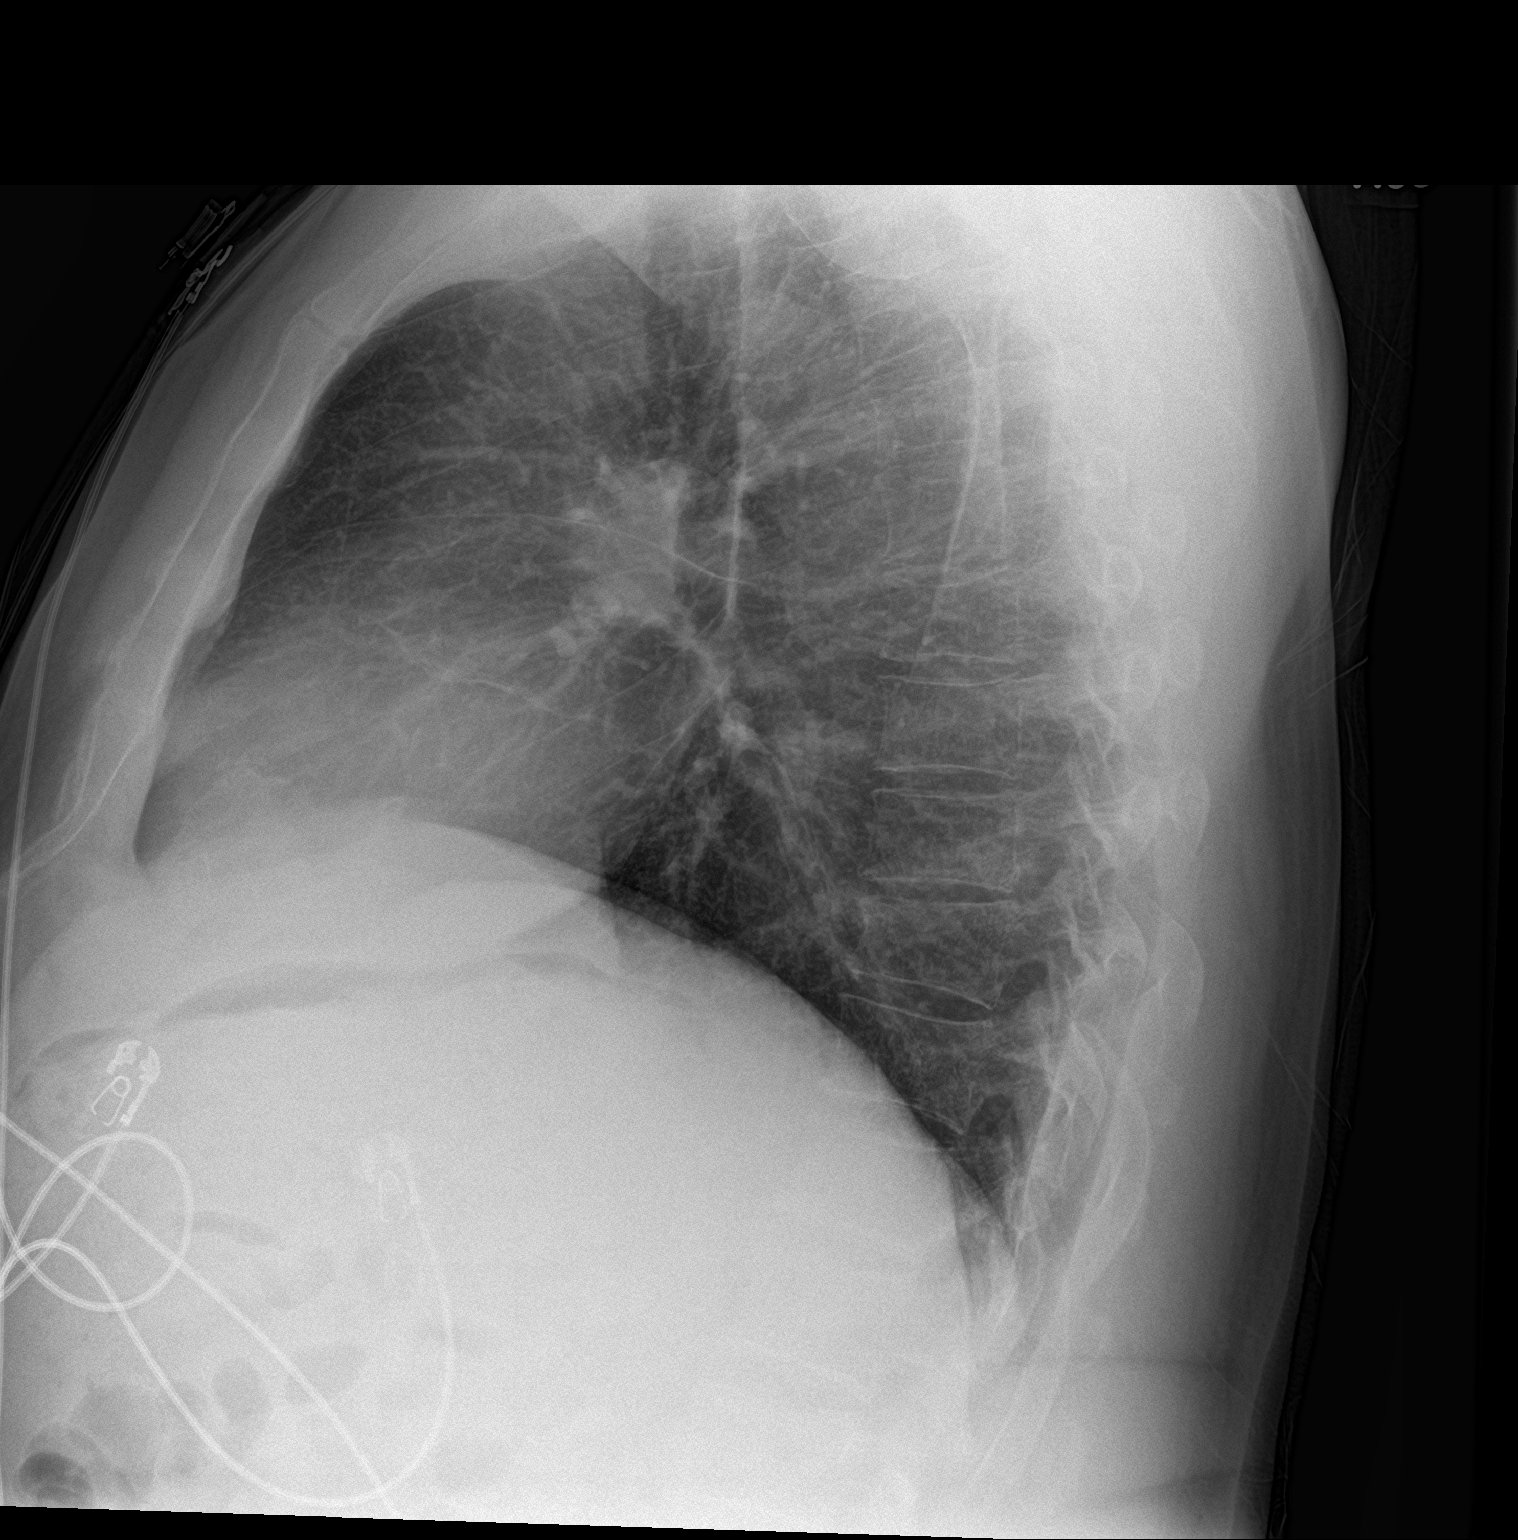

[2 of 2 positions shown; findings below may reference images not displayed]

FINDINGS: The heart size and mediastinal contours are within normal limits.
Linear band of atelectasis or scarring in the peripheral left mid
lung. No focal consolidation. No pleural effusion. No pneumothorax.
No acute osseous abnormality visualized.
IMPRESSION: 1. No acute cardiopulmonary disease

## 2023-04-02 ENCOUNTER — Encounter: Payer: Self-pay | Admitting: Emergency Medicine

## 2023-04-02 ENCOUNTER — Ambulatory Visit
Admission: EM | Admit: 2023-04-02 | Discharge: 2023-04-02 | Disposition: A | Discharge status: Home / Self Care | Payer: 59 | Attending: Family Medicine | Admitting: Family Medicine

## 2023-04-02 DIAGNOSIS — S0501XA Injury of conjunctiva and corneal abrasion without foreign body, right eye, initial encounter: Secondary | ICD-10-CM

## 2023-04-02 MED ORDER — ERYTHROMYCIN 5 MG/GM OP OINT: TOPICAL_OINTMENT | OPHTHALMIC | 0 refills | Status: DC

## 2023-04-02 NOTE — Discharge Instructions (Addendum)
Stop by the pharmacy to pick up your antibiotic eye medication.  Follow up with your primary eyecare provider or [facility] if symptoms suddenly worsen or you have little improvement in your eye symptoms.    

## 2023-04-02 NOTE — ED Provider Notes (Signed)
[facility]    CSN: [id number] Arrival date & time: 04/02/23  1141      History   Chief Complaint Chief Complaint  Patient presents with   Eye Irritation     HPI HPI  Cody Higgins is a 62 y.o. male.    Cody Higgins presents for right eye pain that started yesterday after sanding on a piece of metal with a wire brush. Cody Higgins feels like something is still in his eye but does not see anything. He flushed it out yesterday and again today.    Cody Higgins does wear glasses.  He has some light sensitivity with watery discharge.  Cody Higgins has not had any trouble seeing.  However, eye pain remains.  Cody Higgins has otherwise been well and has no additional concerns today.  History reviewed. No pertinent past medical history.  There are no problems to display for this patient.   Past Surgical History:  Procedure Laterality Date   REPLACEMENT TOTAL KNEE     TONSILLECTOMY         Home Medications    Prior to Admission medications   Medication Sig Start Date End Date Taking? Authorizing Provider  erythromycin ophthalmic ointment Place a 1/2 inch ribbon of ointment into the lower eyelid 3 times a day for 7 days. 04/02/23  Yes Alika Eppes, [place]ard Meth, DO    Family History No family history on file.  Social History Social History   Tobacco Use   Smoking status: Every Day    Types: Cigarettes   Smokeless tobacco: Never  Vaping Use   Vaping status: Never Used  Substance Use Topics   Alcohol use: Not Currently   Drug use: Never     Allergies   Patient has no known allergies.   Review of Systems Review of Systems : negative unless otherwise stated in HPI.      Physical Exam Triage Vital Signs ED Triage Vitals  Encounter Vitals Group     BP      Systolic BP Percentile      Diastolic BP Percentile      Pulse      Resp      Temp      Temp src      SpO2      Weight      Height      Head Circumference      Peak Flow      Pain Score      Pain Loc      Pain Education       Exclude from Growth Chart    No data found.  Updated Vital Signs BP 138/81 (BP Location: Right Arm)   Pulse 62   Temp 98.4 F (36.9 C) (Oral)   Resp 18   SpO2 94%   Visual Acuity Right Eye Distance:   Left Eye Distance:   Bilateral Distance:    Right Eye Near:   Left Eye Near:    Bilateral Near:     Physical Exam  GEN: pleasant well appearing male, in no acute distress  CV: regular rate  RESP: no increased work of breathing EYES:     General: Lids are normal. Lids are everted, no foreign bodies appreciated. Vision grossly intact. Gaze aligned appropriately.        Right eye: No discharge.        Left eye: No foreign body, discharge or hordeolum.     Extraocular Movements: Extraocular movements intact.     PERRLA  Conjunctiva/sclera: right  conjunctiva is injected. No chemosis or hemorrhage.    Comments: fluorescein stain performed, Curvilinear corneal abrasion sweeping from the 9 through 3 o'clock position, rinsed and inspected for foreign bodies  SKIN: warm and dry   UC Treatments / Results  Labs (all labs ordered are listed, but only abnormal results are displayed) Labs Reviewed - No data to display  EKG   Radiology No results found.  Procedures Procedures (including critical care time)  Medications Ordered in UC Medications - No data to display  Initial Impression / Assessment and Plan / UC Course  I have reviewed the triage vital signs and the nursing notes.  Pertinent labs & imaging results that were available during my care of the patient were reviewed by me and considered in my medical decision making (see chart for details).     Patient is a 62 y.o. male who presents after right eye pain for the past day.  On exam, he has a evidence of corneal abrasion seen on fluorescein stain.  Sent erythromycin ointment to the pharmacy.  Advised to follow-up with an ophthalmologist or optometrist, if  discomfort/pain is not improving after 7day course.  Understanding voiced.   Discussed MDM, treatment plan and plan for follow-up with patient who agrees with plan.  Final Clinical Impressions(s) / UC Diagnoses   Final diagnoses:  Abrasion of right cornea, initial encounter     Discharge Instructions      Stop by the pharmacy to pick up your antibiotic eye medication.  Follow up with your primary eyecare provider or [facility] if symptoms suddenly worsen or you have little improvement in your eye symptoms.       ED Prescriptions     Medication Sig Dispense Auth. Provider   erythromycin ophthalmic ointment Place a 1/2 inch ribbon of ointment into the lower eyelid 3 times a day for 7 days. 3.5 g Katha Cabal, DO      PDMP not reviewed this encounter.   Katha Cabal, DO 04/02/23 1209

## 2023-04-02 NOTE — ED Triage Notes (Signed)
Pt states he was sanding yesterday and got something into his eyes. He has tried flushing them out with no relief.

## 2024-01-24 ENCOUNTER — Encounter: Payer: Self-pay | Admitting: Emergency Medicine

## 2024-01-24 ENCOUNTER — Other Ambulatory Visit: Payer: Self-pay

## 2024-01-24 ENCOUNTER — Emergency Department

## 2024-01-24 ENCOUNTER — Emergency Department
Admission: EM | Admit: 2024-01-24 | Discharge: 2024-01-24 | Disposition: A | Discharge status: Home / Self Care | Attending: Emergency Medicine | Admitting: Emergency Medicine

## 2024-01-24 DIAGNOSIS — N23 Unspecified renal colic: Secondary | ICD-10-CM | POA: Diagnosis not present

## 2024-01-24 DIAGNOSIS — N281 Cyst of kidney, acquired: Secondary | ICD-10-CM | POA: Insufficient documentation

## 2024-01-24 DIAGNOSIS — R1032 Left lower quadrant pain: Secondary | ICD-10-CM | POA: Diagnosis present

## 2024-01-24 LAB — URINALYSIS, ROUTINE W REFLEX MICROSCOPIC
Bacteria, UA: NONE SEEN
Bilirubin Urine: NEGATIVE
Glucose, UA: NEGATIVE mg/dL
Ketones, ur: NEGATIVE mg/dL
Leukocytes,Ua: NEGATIVE
Nitrite: NEGATIVE
Protein, ur: 100 mg/dL — AB
RBC / HPF: >50 RBC/hpf (ref 0–5)
Specific Gravity, Urine: 1.02 (ref 1.005–1.030)
pH: 5 (ref 5.0–8.0)

## 2024-01-24 LAB — COMPREHENSIVE METABOLIC PANEL WITH GFR
ALT: 26 U/L (ref 0–44)
AST: 24 U/L (ref 15–41)
Albumin: 4 g/dL (ref 3.5–5.0)
Alkaline Phosphatase: 77 U/L (ref 38–126)
Anion gap: 10 (ref 5–15)
BUN: 22 mg/dL (ref 8–23)
CO2: 24 mmol/L (ref 22–32)
Calcium: 9.2 mg/dL (ref 8.9–10.3)
Chloride: 105 mmol/L (ref 98–111)
Creatinine, Ser: 1.48 mg/dL — ABNORMAL HIGH (ref 0.61–1.24)
GFR, Estimated: 53 mL/min — ABNORMAL LOW (ref >60)
Glucose, Bld: 135 mg/dL — ABNORMAL HIGH (ref 70–99)
Potassium: 4.3 mmol/L (ref 3.5–5.1)
Sodium: 139 mmol/L (ref 135–145)
Total Bilirubin: 0.8 mg/dL (ref 0.0–1.2)
Total Protein: 7.3 g/dL (ref 6.5–8.1)

## 2024-01-24 LAB — CBC
HCT: 40.7 % (ref 39.0–52.0)
Hemoglobin: 13.4 g/dL (ref 13.0–17.0)
MCH: 28.8 pg (ref 26.0–34.0)
MCHC: 32.9 g/dL (ref 30.0–36.0)
MCV: 87.5 fL (ref 80.0–100.0)
Platelets: 258 10*3/uL (ref 150–400)
RBC: 4.65 MIL/uL (ref 4.22–5.81)
RDW: 14.6 % (ref 11.5–15.5)
WBC: 10.3 10*3/uL (ref 4.0–10.5)
nRBC: 0 % (ref 0.0–0.2)

## 2024-01-24 LAB — LIPASE, BLOOD: Lipase: 38 U/L (ref 11–51)

## 2024-01-24 MED ORDER — ONDANSETRON 4 MG PO TBDP: ORAL_TABLET | ORAL | 0 refills | Status: DC

## 2024-01-24 MED ORDER — KETOROLAC TROMETHAMINE 30 MG/ML IJ SOLN
15.0000 mg | Freq: Once | INTRAMUSCULAR | Status: AC
  Administered 2024-01-24: 15 mg via INTRAVENOUS
  Filled 2024-01-24: qty 1

## 2024-01-24 MED ORDER — OXYCODONE-ACETAMINOPHEN 5-325 MG PO TABS: 2.0000 | ORAL_TABLET | Freq: Four times a day (QID) | ORAL | 0 refills | Status: DC | PRN

## 2024-01-24 MED ORDER — ONDANSETRON HCL 4 MG/2ML IJ SOLN
4.0000 mg | INTRAMUSCULAR | Status: AC
  Administered 2024-01-24: 4 mg via INTRAVENOUS
  Filled 2024-01-24: qty 2

## 2024-01-24 MED ORDER — TAMSULOSIN HCL 0.4 MG PO CAPS: ORAL_CAPSULE | ORAL | 0 refills | Status: DC

## 2024-01-24 MED ORDER — DOCUSATE SODIUM 100 MG PO CAPS: ORAL_CAPSULE | ORAL | 0 refills | Status: DC

## 2024-01-24 MED ORDER — MORPHINE SULFATE (PF) 4 MG/ML IV SOLN
4.0000 mg | Freq: Once | INTRAVENOUS | Status: AC
  Administered 2024-01-24: 4 mg via INTRAVENOUS
  Filled 2024-01-24: qty 1

## 2024-01-24 MED ORDER — SODIUM CHLORIDE 0.9 % IV BOLUS
1000.0000 mL | Freq: Once | INTRAVENOUS | Status: AC
  Administered 2024-01-24: 1000 mL via INTRAVENOUS

## 2024-01-24 NOTE — Discharge Instructions (Addendum)
 You have been seen in the Emergency Department (ED) today for pain caused by kidney stones.  As we have discussed, please drink plenty of fluids.  Please make a follow up appointment with the physician(s) listed elsewhere in this documentation.  You may take pain medication as needed but ONLY as prescribed.  Please also take your prescribed Flomax daily.  If you are going to follow up with Urology for possible lithotripsy, please do not take any ibuprofen, naproxen, aspirin, Toradol, or other NSAID after Tuesday morning, as this may exclude you from lithotripsy.  Please see your doctor as soon as possible as stones may take 1-3 weeks to pass and you may require additional care or medications.  Do not drink alcohol, drive or participate in any other potentially dangerous activities while taking opiate pain medication as it may make you sleepy. Do not take this medication with any other sedating medications, either prescription or over-the-counter. If you were prescribed Percocet or Vicodin, do not take these with acetaminophen (Tylenol) as it is already contained within these medications.   Take Percocet as needed for severe pain.  This medication is an opiate (or narcotic) pain medication and can be habit forming.  Use it as little as possible to achieve adequate pain control.  Do not use or use it with extreme caution if you have a history of opiate abuse or dependence.  If you are on a pain contract with your primary care doctor or a pain specialist, be sure to let them know you were prescribed this medication today from the [facility] Emergency Department.  This medication is intended for your use only - do not give any to anyone else and keep it in a secure place where nobody else, especially children, have access to it.  It will also cause or worsen constipation, so you may want to consider taking an over-the-counter stool softener while you are taking this medication.  Remember that it is also  important to follow-up with a urologist because there is a large cystic structure on your left kidney for which you need to see a specialist and likely need an outpatient renal ultrasound to better evaluate the structure.  Dr. Estanislao Heimlich or one of his colleagues should be able to help you with this as well.  Return to the Emergency Department (ED) or call your doctor if you have any worsening pain, fever, painful urination, are unable to urinate, or develop other symptoms that concern you.

## 2024-01-24 NOTE — ED Provider Notes (Signed)
 [facility] Provider Note    Event Date/Time   First MD Initiated Contact with Patient 01/24/24 0321     (approximate)   History   Abdominal Pain   HPI Cody Higgins is a 63 y.o. male who presents for evaluation of acute onset left lower quadrant pain that started around 9 PM tonight.  He has had nausea and vomiting and said that his urine has been dark for a couple of days.  He has not had kidney stones before.  He has had no fever.  The pain started very suddenly tonight and has been excruciating and nothing in particular makes it better or worse.  He said he feels a little bit of urinary hesitancy, like it is difficult to urinate.  The pain radiates into his left flank a little bit.  No chest pain or shortness of breath.     Physical Exam   Triage Vital Signs: ED Triage Vitals  Encounter Vitals Group     BP 01/24/24 0229 134/79     Systolic BP Percentile --      Diastolic BP Percentile --      Pulse Rate 01/24/24 0229 64     Resp 01/24/24 0229 18     Temp 01/24/24 0229 98.3 F (36.8 C)     Temp Source 01/24/24 0229 Oral     SpO2 01/24/24 0229 97 %     Weight 01/24/24 0230 113.4 kg (250 lb)     Height --      Head Circumference --      Peak Flow --      Pain Score 01/24/24 0230 8     Pain Loc --      Pain Education --      Exclude from Growth Chart --     Most recent vital signs: Vitals:   01/24/24 0229  BP: 134/79  Pulse: 64  Resp: 18  Temp: 98.3 F (36.8 C)  SpO2: 97%    General: Awake, appears very uncomfortable and in pain. CV:  Good peripheral perfusion.  Regular rate and rhythm. Resp:  Normal effort. Speaking easily and comfortably, no accessory muscle usage nor intercostal retractions.   Abd:  No distention.  Mild tenderness to palpation of left lower quadrant with moderate reproducible left flank tenderness to percussion.   ED Results / Procedures / Treatments   Labs (all labs ordered are listed, but only abnormal  results are displayed) Labs Reviewed  COMPREHENSIVE METABOLIC PANEL WITH GFR - Abnormal; Notable for the following components:      Result Value   Glucose, Bld 135 (*)    Creatinine, Ser 1.48 (*)    GFR, Estimated 53 (*)    All other components within normal limits  URINALYSIS, ROUTINE W REFLEX MICROSCOPIC - Abnormal; Notable for the following components:   Color, Urine YELLOW (*)    APPearance CLOUDY (*)    Hgb urine dipstick LARGE (*)    Protein, ur 100 (*)    All other components within normal limits  URINE CULTURE  LIPASE, BLOOD  CBC     RADIOLOGY See ED course for details   PROCEDURES:  Critical Care performed: No  Procedures    IMPRESSION / MDM / ASSESSMENT AND PLAN / ED COURSE  I reviewed the triage vital signs and the nursing notes.  Differential diagnosis includes, but is not limited to, renal/ureteral colic, UTI/pyelonephritis, acute kidney injury, musculoskeletal strain, renal ischemia.  Patient's presentation is most consistent with acute presentation with potential threat to life or bodily function.  Labs/studies ordered: CMP, urinalysis, lipase, urine culture, CT renal stone protocol  Interventions/Medications given:  Medications  sodium chloride 0.9 % bolus 1,000 mL (1,000 mLs Intravenous New Bag/Given 01/24/24 0405)  morphine (PF) 4 MG/ML injection 4 mg (4 mg Intravenous Given 01/24/24 0406)  ondansetron (ZOFRAN) injection 4 mg (4 mg Intravenous Given 01/24/24 0405)  ketorolac (TORADOL) 30 MG/ML injection 15 mg (15 mg Intravenous Given 01/24/24 0408)    (Note:  hospital course my include additional interventions and/or labs/studies not listed above.)   Patient's pain and history of present illness is very consistent with renal/ureteral stone.  CT scan pending.  Mild AKI, I ordered 1 L normal saline.  Also ordered morphine 4 mg IV, Zofran 4 mg IV, and Toradol 15 mg IV.  Otherwise labs are reassuring.  Will reassess after  medications and CT scan.   Clinical Course as of 01/24/24 1610  Paulene Boron Jan 24, 2024  0531 CT Renal Lindsay Rho Study I independently viewed and interpreted the patient's CT renal stone study.  He has a stone in his left ureter.  Radiologist confirmed 5 mm left UPJ stone.  Radiologist also identified a cystic structure on his left kidney for which he recommended an outpatient ultrasound.  I updated and reassessed the patient.  He is much more comfortable and his symptoms have resolved to the point he feels comfortable going home.  I expressed the importance of following up with urology both for follow-up on the renal cyst and need for ultrasound, as well as the possibility he may benefit from lithotripsy.  Patient and wife understand and agree with the plan.  The patient's medical screening exam is reassuring with no indication of an emergent medical condition requiring hospitalization or additional evaluation at this point.  The patient is safe and appropriate for discharge and outpatient follow up. [CF]    Clinical Course User Index [CF] Lynnda Sas, MD     FINAL CLINICAL IMPRESSION(S) / ED DIAGNOSES   Final diagnoses:  Ureteral colic  Renal cyst, left     Rx / DC Orders   ED Discharge Orders          Ordered    oxyCODONE-acetaminophen (PERCOCET) 5-325 MG tablet  Every 6 hours PRN        01/24/24 0537    ondansetron (ZOFRAN-ODT) 4 MG disintegrating tablet        01/24/24 0537    tamsulosin (FLOMAX) 0.4 MG CAPS capsule        01/24/24 0537    docusate sodium (COLACE) 100 MG capsule        01/24/24 0537             Note:  This document was prepared using Dragon voice recognition software and may include unintentional dictation errors.   Lynnda Sas, MD 01/24/24 [phone]

## 2024-01-24 NOTE — ED Triage Notes (Signed)
 Pt in with sudden onset of sharp LLQ pain that began tonight at 9pm. +n/v reported, states his urine has been dark and concentrated the past few days

## 2024-01-25 LAB — URINE CULTURE: Culture: NO GROWTH

## 2024-03-22 ENCOUNTER — Ambulatory Visit: Admitting: Urology

## 2024-03-22 VITALS — BP 116/73 | HR 72 | Ht 73.0 in | Wt 240.0 lb

## 2024-03-22 DIAGNOSIS — N529 Male erectile dysfunction, unspecified: Secondary | ICD-10-CM

## 2024-03-22 DIAGNOSIS — Z125 Encounter for screening for malignant neoplasm of prostate: Secondary | ICD-10-CM | POA: Diagnosis not present

## 2024-03-22 DIAGNOSIS — N281 Cyst of kidney, acquired: Secondary | ICD-10-CM | POA: Diagnosis not present

## 2024-03-22 DIAGNOSIS — N200 Calculus of kidney: Secondary | ICD-10-CM

## 2024-03-22 DIAGNOSIS — Z87442 Personal history of urinary calculi: Secondary | ICD-10-CM | POA: Diagnosis not present

## 2024-03-22 MED ORDER — TADALAFIL 10 MG PO TABS
10.0000 mg | ORAL_TABLET | Freq: Every day | ORAL | 11 refills | Status: AC | PRN
Start: 2024-03-22 — End: ?

## 2024-03-22 NOTE — Patient Instructions (Addendum)
 [phone] to schedule renal ultrasound   Erectile Dysfunction Erectile dysfunction (ED) is the inability to get or keep an erection in order to have sexual intercourse. ED is considered a symptom of an underlying disorder and is not considered a disease. ED may include: Inability to get an erection. Lack of enough hardness of the erection to allow penetration. Loss of erection before sex is finished. What are the causes? This condition may be caused by: Physical causes, such as: Artery problems. This may include heart disease, high blood pressure, atherosclerosis, and diabetes. Hormonal problems, such as low testosterone. Obesity. Nerve problems. This may include back or pelvic injuries, multiple sclerosis, Parkinson's disease, spinal cord injury, and stroke. Certain medicines, such as: Pain relievers. Antidepressants. Blood pressure medicines and water pills (diuretics). Cancer medicines. Antihistamines. Muscle relaxants. Lifestyle factors, such as: Use of drugs such as marijuana, cocaine, or opioids. Excessive use of alcohol. Smoking. Lack of physical activity or exercise. Psychological causes, such as: Anxiety or stress. Sadness or depression. Exhaustion. Fear about sexual performance. Guilt. What are the signs or symptoms? Symptoms of this condition include: Inability to get an erection. Lack of enough hardness of the erection to allow penetration. Loss of the erection before sex is finished. Sometimes having normal erections, but with frequent unsatisfactory episodes. Low sexual satisfaction in either partner due to erection problems. A curved penis occurring with erection. The curve may cause pain, or the penis may be too curved to allow for intercourse. Never having nighttime or morning erections. How is this diagnosed? This condition is often diagnosed by: Performing a physical exam to find other diseases or specific problems with the penis. Asking you detailed  questions about the problem. Doing tests, such as: Blood tests to check for diabetes mellitus or high cholesterol, or to measure hormone levels. Other tests to check for underlying health conditions. An ultrasound exam to check for scarring. A test to check blood flow to the penis. Doing a sleep study at home to measure nighttime erections. How is this treated? This condition may be treated by: Medicines, such as: Medicine taken by mouth to help you achieve an erection (oral medicine). Hormone replacement therapy to replace low testosterone levels. Medicine that is injected into the penis. Your health care provider may instruct you how to give yourself these injections at home. Medicine that is delivered with a short applicator tube. The tube is inserted into the opening at the tip of the penis, which is the opening of the urethra. A tiny pellet of medicine is put in the urethra. The pellet dissolves and enhances erectile function. This is also called MUSE (medicated urethral system for erections) therapy. Vacuum pump. This is a pump with a ring on it. The pump and ring are placed on the penis and used to create pressure that helps the penis become erect. Penile implant surgery. In this procedure, you may receive: An inflatable implant. This consists of cylinders, a pump, and a reservoir. The cylinders can be inflated with a fluid that helps to create an erection, and they can be deflated after intercourse. A semi-rigid implant. This consists of two silicone rubber rods. The rods provide some rigidity. They are also flexible, so the penis can both curve downward in its normal position and become straight for sexual intercourse. Blood vessel surgery to improve blood flow to the penis. During this procedure, a blood vessel from a different part of the body is placed into the penis to allow blood to flow around (bypass)  damaged or blocked blood vessels. Lifestyle changes, such as exercising more,  losing weight, and quitting smoking. Follow these instructions at home: Medicines  Take over-the-counter and prescription medicines only as told by your health care provider. Do not increase the dosage without first discussing it with your health care provider. If you are using self-injections, do injections as directed by your health care provider. Make sure you avoid any veins that are on the surface of the penis. After giving an injection, apply pressure to the injection site for 5 minutes. Talk to your health care provider about how to prevent headaches while taking ED medicines. These medicines may cause a sudden headache due to the increase in blood flow in your body. General instructions Exercise regularly, as directed by your health care provider. Work with your health care provider to lose weight, if needed. Do not use any products that contain nicotine or tobacco. These products include cigarettes, chewing tobacco, and vaping devices, such as e-cigarettes. If you need help quitting, ask your health care provider. Before using a vacuum pump, read the instructions that come with the pump and discuss any questions with your health care provider. Keep all follow-up visits. This is important. Contact a health care provider if: You feel nauseous. You are vomiting. You get sudden headaches while taking ED medicines. You have any concerns about your sexual health. Get help right away if: You are taking oral or injectable medicines and you have an erection that lasts longer than 4 hours. If your health care provider is unavailable, go to the nearest emergency room for evaluation. An erection that lasts much longer than 4 hours can result in permanent damage to your penis. You have severe pain in your groin or abdomen. You develop redness or severe swelling of your penis. You have redness spreading at your groin or lower abdomen. You are unable to urinate. You experience chest pain or a rapid  heartbeat (palpitations) after taking oral medicines. These symptoms may represent a serious problem that is an emergency. Do not wait to see if the symptoms will go away. Get medical help right away. Call your local emergency services (911 in the [place] Do not drive yourself to the hospital. Summary Erectile dysfunction (ED) is the inability to get or keep an erection during sexual intercourse. This condition is diagnosed based on a physical exam, your symptoms, and tests to determine the cause. Treatment varies depending on the cause and may include medicines, hormone therapy, surgery, or a vacuum pump. You may need follow-up visits to make sure that you are using your medicines or devices correctly. Get help right away if you are taking or injecting medicines and you have an erection that lasts longer than 4 hours. This information is not intended to replace advice given to you by your health care provider. Make sure you discuss any questions you have with your health care provider. Document Revised: 11/28/2020 Document Reviewed: 11/28/2020 Elsevier Patient Education  2024 [employer].

## 2024-03-22 NOTE — Progress Notes (Signed)
   03/22/24 1:40 PM   Cody Higgins 10/05/1960 [id number]  CC: Nephrolithiasis, ED, left renal cyst, PSA screening  HPI: 63 year old male who presented to the ER on 01/24/2024 with left-sided flank pain.  CT showed a 5 mm left proximal ureteral stone, and he was discharged with medical expulsive therapy.  He passed his stone within a few weeks and his symptoms have completely resolved.  His primary questions today are about ED.SABRA  Testosterone was normal within the last year.  He thinks he has tried both Cialis  and Viagra at max dose without significant improvement.  He was using those medications as needed.  He has had problems with erections for at least a year.  PSA was normal at 0.42.  Incidental finding of 5 cm renal cyst with possible calcification on CT stone protocol and a renal ultrasound was recommended by radiology for further evaluation but has not been completed.    Surgical History: Past Surgical History:  Procedure Laterality Date   REPLACEMENT TOTAL KNEE     TONSILLECTOMY       Family History: No family history on file.  Social History:  reports that he has been smoking cigarettes. He has never used smokeless tobacco. He reports that he does not currently use alcohol. He reports that he does not use drugs.  Physical Exam: BP 116/73 (BP Location: Left Arm, Patient Position: Sitting, Cuff Size: Large)   Pulse 72   Ht 6' 1 (1.854 m)   Wt 240 lb (108.9 kg)   SpO2 96%   BMI 31.66 kg/m    Constitutional:  Alert and oriented, No acute distress. Cardiovascular: No clubbing, cyanosis, or edema. Respiratory: Normal respiratory effort, no increased work of breathing. GI: Abdomen is soft, nontender, nondistended, no abdominal masses   Laboratory Data: Reviewed, see HPI  Pertinent Imaging: I have personally viewed and interpreted the CT scan showing a 5 mm left proximal ureteral stone, left renal cyst with possible calcification.  Assessment & Plan:   63 year old  male with recently spontaneously passed 5 mm stone, chronic ED, left renal cyst, and PSA screening.  Reassurance provided regarding normal PSA and testosterone.  I recommended a trial of Cialis  10 mg daily with a 10 mg boost dose as needed for ED.  He is unsure if he would be interested in considering penile injections.  In terms of the left renal cyst I agree with a renal ultrasound for further evaluation we will call with those results.  Trial of Cialis  10 mg daily with 10 mg boost dose as needed for ED Renal ultrasound for further evaluation of left renal cyst, call with results   Cody Burnet, MD 03/22/2024  [facility] [address], [address] [place], [place] [address] [phone]

## 2024-04-05 ENCOUNTER — Ambulatory Visit: Admission: RE | Admit: 2024-04-05 | Source: Ambulatory Visit
